# Patient Record
Sex: Female | Born: 2000 | Race: Black or African American | Hispanic: No | Marital: Single | Smoking: Current some day smoker
Health system: Southern US, Community
[De-identification: ages and names within clinical notes are randomized; demographics above are authoritative.]

## PROBLEM LIST (undated history)

## (undated) DIAGNOSIS — C562 Malignant neoplasm of left ovary: Secondary | ICD-10-CM

## (undated) HISTORY — PX: OOPHORECTOMY: SHX86

## (undated) HISTORY — DX: Malignant neoplasm of left ovary: C56.2

---
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
## 2020-08-02 DIAGNOSIS — H5213 Myopia, bilateral: Secondary | ICD-10-CM | POA: Diagnosis not present

## 2021-03-08 DIAGNOSIS — Z3046 Encounter for surveillance of implantable subdermal contraceptive: Secondary | ICD-10-CM | POA: Diagnosis not present

## 2021-03-08 DIAGNOSIS — Z113 Encounter for screening for infections with a predominantly sexual mode of transmission: Secondary | ICD-10-CM | POA: Diagnosis not present

## 2021-03-21 DIAGNOSIS — Z30017 Encounter for initial prescription of implantable subdermal contraceptive: Secondary | ICD-10-CM | POA: Diagnosis not present

## 2021-03-21 DIAGNOSIS — Z3046 Encounter for surveillance of implantable subdermal contraceptive: Secondary | ICD-10-CM | POA: Diagnosis not present

## 2021-03-21 DIAGNOSIS — Z3200 Encounter for pregnancy test, result unknown: Secondary | ICD-10-CM | POA: Diagnosis not present

## 2021-03-27 ENCOUNTER — Telehealth: Payer: Self-pay | Admitting: Obstetrics and Gynecology

## 2021-03-27 DIAGNOSIS — C801 Malignant (primary) neoplasm, unspecified: Secondary | ICD-10-CM

## 2021-03-27 NOTE — Telephone Encounter (Signed)
GYN Telephone Call Patient seen at [place] and I was going to set her up for nexplanon removal at the [facility] but nexplanon able to be removed at [facility] and place a new Nexplanon. While reviewing her chart, I saw that she has a h/o a malignant GCT. I contacted the patient at [phone] and she states she has not been seeing anyone for this since she last saw Gyn Onc in 2020. I d/w Dr. Berline Lopes and she recommends continued follow up. I told her we will set this up for her with Gyn Onc.   Durene Romans MD Attending Center for [employer] (Faculty Practice) 03/27/2021 Time: [phone]

## 2021-03-28 ENCOUNTER — Encounter: Payer: Self-pay | Admitting: Obstetrics & Gynecology

## 2021-03-28 ENCOUNTER — Telehealth: Payer: Self-pay | Admitting: *Deleted

## 2021-03-28 NOTE — Telephone Encounter (Signed)
Spoke with the patient and scheduled a follow up appt with Dr Delsa Sale

## 2021-03-29 NOTE — Assessment & Plan Note (Signed)
21 yo w/ Stage IA mixed germ cell tumor Negative symptom review, normal exam.  No evidence of recurrence  >annual f/u; tumor markers can be omitted >genetic testing at age 57

## 2021-03-29 NOTE — Progress Notes (Signed)
Follow Up Note: Gyn-Onc  Meagan Brown 21 y.o. female  CC: She presents for a f/u visit   HPI: The oncology history was reviewed.  Interval History: She denies abdominal distention, pain, weight loss or change in her bowel habits.   In 8/20 AFP was 1.6, HCG was < 1, LDH was 172.   Review of Systems  Review of Systems  Constitutional:  Negative for malaise/fatigue and weight loss.  Respiratory:  Negative for shortness of breath and wheezing.   Cardiovascular:  Negative for chest pain and leg swelling.  Gastrointestinal:  Negative for abdominal pain, blood in stool, constipation, nausea and vomiting.  Genitourinary:  Negative for dysuria, frequency, hematuria and urgency.  Musculoskeletal:  Negative for joint pain and myalgias.  Neurological:  Negative for weakness.  Psychiatric/Behavioral:  Negative for depression. The patient does not have insomnia.    Current medications, allergy, social history, past surgical history, past medical history, family history were all reviewed.    Vitals:  BP 131/72 (BP Location: Right Arm, Patient Position: Sitting)    Pulse 68    Temp 98.9 F (37.2 C) (Oral)    Resp 16    Ht 5' 5.75" (1.67 m)    Wt 217 lb 9.6 oz (98.7 kg)    SpO2 100%    BMI 35.39 kg/m    Physical Exam:  Physical Exam Exam conducted with a chaperone present.  Constitutional:      General: She is not in acute distress. Cardiovascular:     Rate and Rhythm: Normal rate and regular rhythm.  Pulmonary:     Effort: Pulmonary effort is normal.     Breath sounds: Normal breath sounds. No wheezing or rhonchi.  Abdominal:     Palpations: Abdomen is soft.     Tenderness: There is no abdominal tenderness. There is no right CVA tenderness or left CVA tenderness.     Hernia: No hernia is present.  Genitourinary:    General: Normal vulva.     Urethra: No urethral lesion.     Vagina: No lesions. No bleeding.  Thin white discharge present Musculoskeletal:     Cervical back: Neck  supple.     Right lower leg: No edema.     Left lower leg: No edema.  Lymphadenopathy:     Upper Body:     Right upper body: No supraclavicular adenopathy.     Left upper body: No supraclavicular adenopathy.     Lower Body: No right inguinal adenopathy. No left inguinal adenopathy.  Skin:    Findings: No rash.  Neurological:     Mental Status: She is oriented to person, place, and time.   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  No evidence of recurrence  >annual f/u; tumor markers can be omitted >genetic testing at age 55   I personally spent 25 minutes face-to-face and non-face-to-face in the care of this patient, which includes all pre, intra, and post visit time on the date of service.     Lahoma Crocker, MD

## 2021-03-30 ENCOUNTER — Other Ambulatory Visit: Payer: Self-pay

## 2021-03-30 ENCOUNTER — Inpatient Hospital Stay: Payer: Medicaid Other | Attending: Obstetrics & Gynecology | Admitting: Obstetrics & Gynecology

## 2021-03-30 ENCOUNTER — Encounter: Payer: Self-pay | Admitting: Obstetrics & Gynecology

## 2021-03-30 VITALS — BP 131/72 | HR 68 | Temp 98.9°F | Resp 16 | Ht 65.75 in | Wt 217.6 lb

## 2021-03-30 DIAGNOSIS — C579 Malignant neoplasm of female genital organ, unspecified: Secondary | ICD-10-CM | POA: Diagnosis not present

## 2021-03-30 DIAGNOSIS — C801 Malignant (primary) neoplasm, unspecified: Secondary | ICD-10-CM

## 2021-03-30 NOTE — Patient Instructions (Signed)
Return in 1 yr 

## 2021-04-03 ENCOUNTER — Telehealth: Payer: Self-pay

## 2021-04-03 DIAGNOSIS — Z09 Encounter for follow-up examination after completed treatment for conditions other than malignant neoplasm: Secondary | ICD-10-CM

## 2021-04-03 NOTE — Telephone Encounter (Signed)
SWCM called pt regarding adolescent transition policy. No answer, Left VM explaining that pt is now 21 y.o and needs an adult PCP, and will no longer be allowed to be seen at [facility]. Pt has not been seen in approx 3 years. Pt currently receiving care from OBGYN. SWCM left message for pt asking for a call back if pt wants assistance choosing and scheduling with an adult PCP. Pt also sent Adolescent Transition letter with same information left on VM.   Lenn Sink, BSW, QP Case Manager [employer] [facility] Office: [phone] Direct Number: [phone]

## 2021-04-04 ENCOUNTER — Telehealth: Payer: Self-pay

## 2021-04-04 DIAGNOSIS — Z09 Encounter for follow-up examination after completed treatment for conditions other than malignant neoplasm: Secondary | ICD-10-CM

## 2021-04-04 NOTE — Telephone Encounter (Signed)
SWCM called Practice Admin Susette Racer) at [facility] Internal Medicine to refer pt for adult primary care. Left VM for Doris to please call pt for scheduling initial visit.    Lenn Sink, BSW, QP Case Manager [employer] [facility] Child [facility] Office: [phone] Direct Number: [phone]

## 2021-04-13 ENCOUNTER — Other Ambulatory Visit: Payer: Self-pay

## 2021-04-13 ENCOUNTER — Encounter: Payer: Self-pay | Admitting: Internal Medicine

## 2021-04-13 ENCOUNTER — Ambulatory Visit (INDEPENDENT_AMBULATORY_CARE_PROVIDER_SITE_OTHER): Payer: Medicaid Other | Admitting: Internal Medicine

## 2021-04-13 DIAGNOSIS — E669 Obesity, unspecified: Secondary | ICD-10-CM

## 2021-04-13 DIAGNOSIS — Z0000 Encounter for general adult medical examination without abnormal findings: Secondary | ICD-10-CM | POA: Insufficient documentation

## 2021-04-13 DIAGNOSIS — N926 Irregular menstruation, unspecified: Secondary | ICD-10-CM

## 2021-04-13 DIAGNOSIS — E663 Overweight: Secondary | ICD-10-CM | POA: Insufficient documentation

## 2021-04-13 DIAGNOSIS — L732 Hidradenitis suppurativa: Secondary | ICD-10-CM | POA: Diagnosis not present

## 2021-04-13 HISTORY — DX: Overweight: E66.3

## 2021-04-13 NOTE — Assessment & Plan Note (Signed)
Patient is going to ask her grandmother if she completed the HPV vaccine series. If she did not, I recommended she get this vaccine to protect her against cervical cancer (especially given her history of the previous mixed germ cell tumor)  Declined flu and covid vaccines  Was last tested for gonorrhea/chlamydia in January by the health department, prior to getting her nexplanon inserted. Tests came back negative.

## 2021-04-13 NOTE — Assessment & Plan Note (Signed)
BMI 36 today. Discussed diet and exercise with the patient. She notes that her diet is sporadic and she only consistently eats two meals a day, as she works the night shift at [employer]. As far as exercise goes, she states that she gets to the gym about 2-3 times per week. Discussed trying to get in more often, as we recommend some form of physical activity each day.

## 2021-04-13 NOTE — Progress Notes (Signed)
° °  CC: establish care   HPI:  [name] is a 21 y.o. female with a history of a stage IA mixed germ cell tumor who presents to the [facility] to establish care. Please see problem-based list for further details, assessments, and plans.   Past Medical History:  Diagnosis Date   Ovarian cancer on left [facility])    s/p Exlap/LSO 09/2017 - Malignant Germ Cell Tumor   PMHx: germ cell tumor  FDLNMP: does not get regular menstrual cycles, has Nexplanon (placed 03/21/21) Meds: none Surg Hx: oophorectomy 2019 Social Hx: Vaping x 1 yr, no alcohol or drug use. Lives with grandmother in [place]. Works at [employer]. Exercises 2-3 times per week.  Family Hx: HTN, DM  Review of Systems:  Review of Systems  Constitutional:  Negative for chills and fever.  HENT: Negative.    Respiratory:  Negative for cough and shortness of breath.   Cardiovascular:  Negative for chest pain and palpitations.  Gastrointestinal:  Negative for diarrhea, nausea and vomiting.  Genitourinary: Negative.   Musculoskeletal:  Negative for back pain and myalgias.  Neurological:  Negative for dizziness, loss of consciousness and headaches.    Physical Exam:  Vitals:   04/13/21 0907  BP: 129/71  Pulse: 79  Temp: 98.4 F (36.9 C)  TempSrc: Oral  SpO2: 99%  Weight: 221 lb 9.6 oz (100.5 kg)   General: Pleasant, well-appearing female. No acute distress. CV: RRR. No murmurs. No LE edema Pulmonary: Lungs CTAB. Normal effort.  Abdominal: Soft, nontender, nondistended.  Extremities: Palpable radial and DP pulses.  Skin: Warm and dry. Neuro: A&Ox3. No focal deficit. Psych: Normal mood and affect   Assessment & Plan:   See Encounters Tab for problem based charting.  Patient discussed with Dr. Daryll Drown

## 2021-04-13 NOTE — Assessment & Plan Note (Signed)
Patient notes that a provider at the health department had previously told her she may have hidradenitis suppurativa. She notes that for the past year and a half, she will intermittently get "boils/cysts" under her armpits, which have since resolved, although it has left some scarring. Discussed with the patient that the next time she gets a flare, she should call our clinic to be seen. Will continue to monitor.

## 2021-04-13 NOTE — Assessment & Plan Note (Signed)
Patient has not had a menstrual period in a while/does not get them regularly. She has had the nexplanon for the past 4 years, most recently having it replaced on 1/31 by the health department.

## 2021-04-13 NOTE — Patient Instructions (Signed)
Thank you, Meagan Brown for allowing [place] to provide your care today. Today we discussed:  Healthcare maintenance: Ask your grandma if you completed the HPV vaccine series. If not, we would recommend you get these vaccines to help protect you from certain types of cervical cancer.  Diet/exercise: Continue going to the gym 2-3 times per week as you are able. We recommend some form of physical activity per day.   Possible hidradenitis suppurativa: Please call our clinic (and take a picture) when you develop another boil/cyst under your armpit so we can evaluate you and treat you   I have ordered the following labs for you:  Lab Orders  No laboratory test(s) ordered today     Tests ordered today:  none  Referrals ordered today:   Referral Orders  No referral(s) requested today     I have ordered the following medication/changed the following medications:   Stop the following medications: There are no discontinued medications.   Start the following medications: No orders of the defined types were placed in this encounter.    Follow up: 1 year    Remember: To call to be seen sooner if you develop another cyst/boil under your armpits.   Should you have any questions or concerns please call the internal medicine clinic at [phone].     Buddy Duty, D.O. [place]

## 2021-04-17 NOTE — Progress Notes (Signed)
Internal Medicine Clinic Attending ° °Case discussed with Dr. Atway  at the time of the visit.  We reviewed the resident’s history and exam and pertinent patient test results.  I agree with the assessment, diagnosis, and plan of care documented in the resident’s note.  °

## 2021-04-28 ENCOUNTER — Telehealth: Payer: Self-pay | Admitting: *Deleted

## 2021-04-28 NOTE — Telephone Encounter (Signed)
Patient called and stated "I used the bathroom last night and had blood in my stool. I wanted to get this checked out." Per Dr Berline Lopes patient needs to call her PCP  ?

## 2021-12-06 ENCOUNTER — Other Ambulatory Visit: Payer: Self-pay | Admitting: Student

## 2021-12-06 ENCOUNTER — Encounter: Payer: Self-pay | Admitting: Student

## 2021-12-06 ENCOUNTER — Ambulatory Visit (INDEPENDENT_AMBULATORY_CARE_PROVIDER_SITE_OTHER): Payer: Medicaid Other | Admitting: Student

## 2021-12-06 VITALS — BP 139/81 | HR 98 | Temp 98.3°F | Ht 64.0 in | Wt 229.3 lb

## 2021-12-06 DIAGNOSIS — Z6839 Body mass index (BMI) 39.0-39.9, adult: Secondary | ICD-10-CM | POA: Diagnosis not present

## 2021-12-06 DIAGNOSIS — E669 Obesity, unspecified: Secondary | ICD-10-CM | POA: Diagnosis not present

## 2021-12-06 DIAGNOSIS — H547 Unspecified visual loss: Secondary | ICD-10-CM

## 2021-12-06 DIAGNOSIS — Z0000 Encounter for general adult medical examination without abnormal findings: Secondary | ICD-10-CM

## 2021-12-06 DIAGNOSIS — Z0101 Encounter for examination of eyes and vision with abnormal findings: Secondary | ICD-10-CM

## 2021-12-06 MED ORDER — SEMAGLUTIDE-WEIGHT MANAGEMENT 0.25 MG/0.5ML ~~LOC~~ SOAJ: SUBCUTANEOUS | 55 refills | Status: DC

## 2021-12-06 NOTE — Progress Notes (Unsigned)
   CC: Follow-up  HPI:  [name] is a 21 y.o. female with history as below presents for follow-up visit.  Please see encounters tab for problem-based charting.  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 In no acute distress. HENT: Normocephalic, atraumatic,  Eyes: Sclera non-icteric, PERRL, EOM intact.  Limited funduscopic exam without abnormalities Vision screen: OD 20/15, OS 20/100 Cardio:Regular rate and rhythm. No murmurs, rubs, or gallops. 2+ bilateral radial and dorsalis pedis  pulses. Pulm:Clear to auscultation bilaterally. Normal work of breathing on room air. Abdomen: Soft, non-tender, non-distended, positive bowel sounds. UEA:[id number] for extremity edema. Skin:Warm and dry. Neuro:Alert and oriented x3. No focal deficit noted. Psych:Pleasant mood and affect.   Assessment & Plan:   See Encounters Tab for problem based charting.  Patient seen with Dr. Philipp Ovens

## 2021-12-06 NOTE — Patient Instructions (Signed)
  Thank you, [place], for allowing [place] to provide your care today. Today we discussed . . .  > Weight loss       -We believe that after trying all of the lifestyle modifications and not having success in weight loss that you would benefit from a medication.  We will start you on semaglutide 0.25 mg weekly and then plan to increase it every 4 weeks to 0.5 mg then 1 mg then 1.7 mg then 2.4 mg weekly.  We also discussed changing the Nexplanon for an IUD and you may call us to get a referral to OB/GYN at any time if you decide you would like to try that.  I have also attached information about the Mediterranean and Dash diet that may be helpful. > Poor vision       -We will send you to a ophthalmologist to have your eyes evaluated as you have had trouble getting glasses that work for you. > HPV vaccine       -You may get your HPV vaccines at the health department and we recommend that you do this.  I have attached some information about HPV to this.   I have ordered the following labs for you:   Lab Orders         Hemoglobin A1c       Tests ordered today:  None   Referrals ordered today:    Referral Orders         Ambulatory referral to Ophthalmology       I have ordered the following medication/changed the following medications:   Stop the following medications: There are no discontinued medications.   Start the following medications: No orders of the defined types were placed in this encounter.     Follow up: 1 year    Remember:     Should you have any questions or concerns please call the internal medicine clinic at [phone].     Johny Blamer, [place]

## 2021-12-07 ENCOUNTER — Encounter: Payer: Self-pay | Admitting: Student

## 2021-12-07 LAB — HEMOGLOBIN A1C
Est. average glucose Bld gHb Est-mCnc: 114 mg/dL
Hgb A1c MFr Bld: 5.6 % (ref 4.8–5.6)

## 2021-12-07 NOTE — Progress Notes (Signed)
Internal Medicine Clinic Attending  Case discussed with Dr. Nikki Dom  At the time of the visit.  We reviewed the resident's history and exam and pertinent patient test results.  I agree with the assessment, diagnosis, and plan of care documented in the resident's note.

## 2021-12-07 NOTE — Progress Notes (Signed)
Patient called and updated on the results of her hemoglobin A1c of 5.6.  All questions were answered.

## 2021-12-07 NOTE — Assessment & Plan Note (Addendum)
Patient is still worried about her weight.  Weight is stable from prior visit in 03/2021.  She states her weight noticeably went up after she got her first Nexplanon several years ago.  She states that she is very active at work and does cardio exercise 2 times a week.  She does state that her diet is hard to keep under control due to her long working hours and eating late at night.  She has tried several restrictive diets without much success.  She was counseled on the Mediterranean and Dash diet today.  We discussed medical therapies with GLP-1's and she is interested in these.  Patient also counseled on the IUD having less weight gain side effects than the systemic progesterone birth control.  She will consider and may get referral to OB/GYN if she wants.  Patient had an episode 2 weeks ago of 2 hours of blurry vision and tension headache like her normal headaches.  No inciting event and went away after she got saline eat.  Had some mild nausea but no vomiting.  She checked her blood glucose using her grandmothers monitor and it was 90 during the event.  No previous similar events or subsequent. - Continue with lifestyle modification and start semaglutide if able to get insurance coverage - A1c today

## 2021-12-07 NOTE — Assessment & Plan Note (Signed)
Patient is still unaware if she did or did not get the HPV vaccine.  Patient given information on HPV today and told that she may get the HPV vaccine at the health department.

## 2021-12-07 NOTE — Assessment & Plan Note (Addendum)
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  She has been to the optometrist multiple times without satisfactory results in her glasses.  She denies any acute worsening in her vision.  Vision testing today showed OD 20/15, OS 20/100. - Referral to ophthalmology for further evaluation

## 2022-02-21 ENCOUNTER — Other Ambulatory Visit: Payer: Self-pay

## 2022-02-21 ENCOUNTER — Ambulatory Visit: Payer: Medicaid Other

## 2022-02-21 VITALS — BP 126/80 | HR 82 | Temp 98.5°F | Ht 64.0 in | Wt 230.7 lb

## 2022-02-21 DIAGNOSIS — M546 Pain in thoracic spine: Secondary | ICD-10-CM | POA: Diagnosis not present

## 2022-02-21 DIAGNOSIS — M549 Dorsalgia, unspecified: Secondary | ICD-10-CM | POA: Insufficient documentation

## 2022-02-21 DIAGNOSIS — E669 Obesity, unspecified: Secondary | ICD-10-CM

## 2022-02-21 DIAGNOSIS — L732 Hidradenitis suppurativa: Secondary | ICD-10-CM

## 2022-02-21 HISTORY — DX: Dorsalgia, unspecified: M54.9

## 2022-02-21 MED ORDER — SEMAGLUTIDE-WEIGHT MANAGEMENT 0.25 MG/0.5ML ~~LOC~~ SOAJ: SUBCUTANEOUS | 55 refills | Status: AC

## 2022-02-21 MED ORDER — DOXYCYCLINE HYCLATE 50 MG PO CAPS: 100.0000 mg | ORAL_CAPSULE | Freq: Every day | ORAL | 0 refills | Status: DC

## 2022-02-21 NOTE — Assessment & Plan Note (Signed)
Endorses significant weight gain after nexplanon placement. Discussed continued lifestyle changes including exercise recommendations and dietary changes which were detailed at last visit. She is still working a lot and has difficulty with managing her diet. Never was able to pick up ozempic which was previously prescribed.   Will continue working on lifestyle changes and referred to nutrition as well. Resent ozempic. Discussed switching nexplanon for IUD but she is considering cessation of contraception after 6 months-1 yr to try to have a child so IUD placement might not make sense for that timeframe.

## 2022-02-21 NOTE — Assessment & Plan Note (Addendum)
Has active HS lesions under armpits and on R inguinal fold with scarring and small draining cysts which are consistent with HS. Has had flares of these lesions for the past 1-2 years. A1c was obtained at last visit and wnl. She does not smoke cigarettes, but vapes.   Discussed vaping cessation and wound care/cleaning. Started doxycyline '100mg'$  daily and will continue until next visit in 4 weeks but likely will need to keep going for a couple months and then reassess. Also will refer to [facility] clinic at [facility] dermatology.

## 2022-02-21 NOTE — Assessment & Plan Note (Addendum)
Has upper back pain and neck pain around trapezius muscles. She says this has gotten worse due to weight gain after having nexplanon placed and thinks this might be due to breast enlargement. Pain feels sore and improves when she wears supportive bras and temporarily with massages. Asked about breast reduction surgery. Exam is unremarkable.  Will treat conservatively and focus on weight loss and ibuprofen, back massages/exercises, and warm compresses.

## 2022-02-21 NOTE — Progress Notes (Signed)
   CC: back.neck pain  HPI:  Meagan Brown is a 22 y.o.-year-old female with past medical history as below presenting for back/neck pain,. HS, obesity.  Please see encounters tab for problem-based charting.  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  Please see encounters tab for problem based charting.  Physical Exam:  Vitals:   02/21/22 1426  BP: 126/80  Pulse: 82  Temp: 98.5 F (36.9 C)  SpO2: 100%  Weight: 230 lb 11.2 oz (104.6 kg)  Height: '5\' 4"'$  (1.626 m)   General:Well-appearing, pleasant, In NAD Cardiac: RRR, no murmurs rubs or gallops. Respiratory: Normal work of breathing on room air, CTAB Abdominal: Soft, nontender, nondistended Skin: scarring and draining small ulcers under bilateral axillae and in R inguinal fold MSK: no point tenderness spine, minimal tenderness over upper back and neck musculature.  Assessment & Plan:   Back pain Has upper back pain and neck pain around trapezius muscles. She says this has gotten worse due to weight gain after having nexplanon placed and thinks this might be due to breast enlargement. Pain feels sore and improves when she wears supportive bras and temporarily with massages. Asked about breast reduction surgery. Exam is unremarkable.  Will treat conservatively and focus on weight loss and ibuprofen, back massages/exercises, and warm compresses.   Obesity (BMI 30-39.9) Endorses significant weight gain after nexplanon placement. Discussed continued lifestyle changes including exercise recommendations and dietary changes which were detailed at last visit. She is still working a lot and has difficulty with managing her diet. Never was able to pick up ozempic which was previously prescribed.   Will continue working on lifestyle changes and  referred to nutrition as well. Resent ozempic. Discussed switching nexplanon for IUD but she is considering cessation of contraception after 6 months-1 yr to try to have a child so IUD placement might not make sense for that timeframe.   Hidradenitis suppurativa Has active HS lesions under armpits and on R inguinal fold with scarring and small draining cysts which are consistent with HS. Has had flares of these lesions for the past 1-2 years. A1c was obtained at last visit and wnl. She does not smoke cigarettes, but vapes.   Discussed vaping cessation and wound care/cleaning. Started doxycyline '100mg'$  daily and will continue until next visit in 4 weeks but likely will need to keep going for a couple months and then reassess. Also will refer to [facility] clinic at [facility] dermatology.    Patient discussed with Dr. Dareen Piano

## 2022-02-21 NOTE — Patient Instructions (Addendum)
[place], it was a pleasure seeing you today! You endorsed feeling well today. Below are some of the things we talked about this visit. We look forward to seeing you in the follow up appointment!  Today we discussed: Hidradenitis: Please take '100mg'$  of doxycycline daily for the next 4 weeks until your next appointment and we will reassess at that point. You will likely need to continue taking the medicine for longer depending on how your skin wounds look then. Continue taking good care of the wounds by washing them thoroughly and try to use dressings that won't stick too much to the skin. I will be referring you to the [facility] HS clinic as well.  Weight loss: Try to pick up ozempic and continue working on exercise and your meal plan. You can also meet with our nutritionist if you think that would help. For your neck and back pain, you can try supportive bras, ibuprofen, warm compresses, and massages/back exercises  I have ordered the following labs today:  Lab Orders  No laboratory test(s) ordered today      Referrals ordered today:   Referral Orders  No referral(s) requested today     I have ordered the following medication/changed the following medications:   Stop the following medications: There are no discontinued medications.   Start the following medications: No orders of the defined types were placed in this encounter.    Follow-up:  4 weeks    Please make sure to arrive 15 minutes prior to your next appointment. If you arrive late, you may be asked to reschedule.   We look forward to seeing you next time. Please call our clinic at [phone] if you have any questions or concerns. The best time to call is Monday-Friday from 9am-4pm, but there is someone available 24/7. If after hours or the weekend, call the main hospital number and ask for the Internal Medicine Resident On-Call. If you need medication refills, please notify your pharmacy one week in advance and they  will send [place] a request.  Thank you for letting us take part in your care. Wishing you the best!  Thank you, Linus Galas MD

## 2022-02-27 ENCOUNTER — Telehealth: Payer: Self-pay | Admitting: *Deleted

## 2022-02-27 NOTE — Telephone Encounter (Signed)
Call from pt who stated she was prescribed Doxycycline ( 02/21/21 per Dr Jodell Cipro); she took it x 1 day, developed N/V and diarrhea. Stated she had to go home from work and this lasted until the next day. She wants to know what should she do ? Thanks

## 2022-03-01 NOTE — Addendum Note (Signed)
Addended by: Aldine Contes on: 03/01/2022 11:27 AM   Modules accepted: Level of Service

## 2022-03-01 NOTE — Progress Notes (Signed)
Internal Medicine Clinic Attending  Case discussed with Dr. Jodell Cipro  At the time of the visit.  We reviewed the resident's history and exam and pertinent patient test results.  I agree with the assessment, diagnosis, and plan of care documented in the resident's note.

## 2022-03-02 MED ORDER — MINOCYCLINE HCL 100 MG PO CAPS: 100.0000 mg | ORAL_CAPSULE | Freq: Two times a day (BID) | ORAL | 0 refills | Status: DC

## 2022-03-02 NOTE — Telephone Encounter (Signed)
Patient called about n/v after trying doxycycline which was started for HS. Changed doxycycline to minocycline '100mg'$  BID and precautioned about n/v and photosensitivity. If she also has a reaction to this we can try clindamycin and rifampin for coverage in the future. Still waiting on HS clinic referral.

## 2022-03-23 ENCOUNTER — Encounter: Payer: Self-pay | Admitting: Student

## 2022-03-23 ENCOUNTER — Ambulatory Visit (INDEPENDENT_AMBULATORY_CARE_PROVIDER_SITE_OTHER): Payer: Medicaid Other | Admitting: Student

## 2022-03-23 VITALS — BP 125/79 | HR 81 | Temp 98.2°F | Wt 229.4 lb

## 2022-03-23 DIAGNOSIS — L732 Hidradenitis suppurativa: Secondary | ICD-10-CM

## 2022-03-23 DIAGNOSIS — C801 Malignant (primary) neoplasm, unspecified: Secondary | ICD-10-CM

## 2022-03-23 MED ORDER — CLINDAMYCIN PHOSPHATE 1 % EX SOLN: Freq: Two times a day (BID) | CUTANEOUS | 1 refills | Status: DC

## 2022-03-23 NOTE — Patient Instructions (Signed)
Meagan Brown,  It was a pleasure seeing you in the clinic today.   As we discussed, I have prescribed a topical antibiotic called clindamycin. Please apply this to your affected areas twice a day. Please make sure to contact your gynecologist at the cancer center to schedule an appointment for follow up and to get your pap smear. Please come back to see [place] in 3 months (or sooner if needed).  Please call our clinic at [phone] if you have any questions or concerns. The best time to call is Monday-Friday from 9am-4pm, but there is someone available 24/7 at the same number. If you need medication refills, please notify your pharmacy one week in advance and they will send [place] a request.   Thank you for letting us take part in your care. We look forward to seeing you next time!

## 2022-03-23 NOTE — Assessment & Plan Note (Signed)
She is due for follow up with Gyn-Onc. She is going to call them to schedule this. Refused PAP smear today, states will get it done with Gyn-Onc.

## 2022-03-23 NOTE — Progress Notes (Signed)
   CC: f/u hidradenitis supportiva  HPI:  [name] is a 22 y.o. female with history listed below presenting to the [facility] for f/u of hidradenitis supportiva. Please see individualized problem based charting for full HPI.  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  Physical Exam:  Vitals:   03/23/22 0853  BP: 125/79  Pulse: 81  Temp: 98.2 F (36.8 C)  TempSrc: Oral  SpO2: 100%  Weight: 229 lb 6.4 oz (104.1 kg)   Physical Exam Constitutional:      Appearance: Normal appearance. She is obese. She is not ill-appearing.  HENT:     Mouth/Throat:     Mouth: Mucous membranes are moist.     Pharynx: Oropharynx is clear.  Eyes:     Extraocular Movements: Extraocular movements intact.     Conjunctiva/sclera: Conjunctivae normal.     Pupils: Pupils are equal, round, and reactive to light.  Cardiovascular:     Rate and Rhythm: Normal rate and regular rhythm.     Pulses: Normal pulses.     Heart sounds: Normal heart sounds. No murmur heard.    No friction rub. No gallop.  Pulmonary:     Effort: Pulmonary effort is normal. No respiratory distress.     Breath sounds: Normal breath sounds.  Abdominal:     General: Bowel sounds are normal. There is no distension.     Palpations: Abdomen is soft.     Tenderness: There is no abdominal tenderness.  Musculoskeletal:        General: No swelling. Normal range of motion.  Skin:    General: Skin is warm and dry.     Comments: Patient with hypertrophic scarring of axillae bilaterally. She has 1 small area on each side with mild inflammation, but otherwise no areas of fluctuance, drainage, skin tunnels noted. No TTP of bilateral axillary regions. Patient refused examination of R inguinal  area.  Neurological:     General: No focal deficit present.     Mental Status: She is alert and oriented to person, place, and time.  Psychiatric:        Mood and Affect: Mood normal.        Behavior: Behavior normal.      Assessment & Plan:   See Encounters Tab for problem based charting.  Patient discussed with Dr.  Saverio Danker

## 2022-03-23 NOTE — Assessment & Plan Note (Signed)
Patient living with HS, primarily located in bilateral armpits and her R inguinal fold. She does note having flares for the past couple of years in these areas, with her R axillary region typically being the most affected. She was started on doxycycline last month but developed nausea from this and was switched to minocycline. She states that she took the minocycline for about 2 weeks but notes increased nightmares with this and thus stopped taking it. She does not feel that she tolerates oral antibiotics well and thus wishes to discuss other options.  On exam, she does have hypertrophic scarring of axillary regions bilaterally. She does have a small area on each side with mild inflammation but no areas of fluctuance, drainage, sinus tracts noted. No TTP of these regions as well. She did refuse examination of her R inguinal fold but states that area has improved.   We discussed switching to topical clindamycin solution for management of her affected areas given inability to tolerate tetracycline therapy. She is interested in using this.   She continues to vape and is not currently interested in quitting. We also discussed maintain good skin hygiene and discussed importance of weight loss.  Plan: -switch to topical clindamycin solution BID -skin hygiene, weight loss, vaping cessation (when ready)

## 2022-03-26 NOTE — Progress Notes (Signed)
Internal Medicine Clinic Attending  Case discussed with Dr. Jinwala  At the time of the visit.  We reviewed the resident's history and exam and pertinent patient test results.  I agree with the assessment, diagnosis, and plan of care documented in the resident's note.  

## 2022-05-01 ENCOUNTER — Telehealth: Payer: Self-pay | Admitting: Dietician

## 2022-05-01 NOTE — Telephone Encounter (Signed)
Left voicemail for return call to discuss her referral/appointment.

## 2022-05-02 ENCOUNTER — Encounter: Payer: Medicaid Other | Admitting: Dietician

## 2022-05-23 ENCOUNTER — Encounter: Payer: Self-pay | Admitting: *Deleted

## 2022-05-23 ENCOUNTER — Inpatient Hospital Stay: Payer: Medicaid Other

## 2022-05-23 ENCOUNTER — Encounter: Payer: Self-pay | Admitting: Obstetrics & Gynecology

## 2022-05-23 ENCOUNTER — Inpatient Hospital Stay: Payer: Medicaid Other | Attending: Obstetrics & Gynecology | Admitting: Obstetrics & Gynecology

## 2022-05-23 ENCOUNTER — Other Ambulatory Visit (HOSPITAL_COMMUNITY)
Admission: RE | Admit: 2022-05-23 | Discharge: 2022-05-23 | Disposition: A | Discharge status: Home / Self Care | Payer: Medicaid Other | Source: Ambulatory Visit | Attending: Obstetrics & Gynecology | Admitting: Obstetrics & Gynecology

## 2022-05-23 ENCOUNTER — Other Ambulatory Visit: Payer: Self-pay

## 2022-05-23 VITALS — BP 138/87 | HR 85 | Temp 98.7°F | Resp 16 | Ht 64.0 in | Wt 230.2 lb

## 2022-05-23 DIAGNOSIS — C801 Malignant (primary) neoplasm, unspecified: Secondary | ICD-10-CM

## 2022-05-23 DIAGNOSIS — Z113 Encounter for screening for infections with a predominantly sexual mode of transmission: Secondary | ICD-10-CM | POA: Diagnosis not present

## 2022-05-23 DIAGNOSIS — Z124 Encounter for screening for malignant neoplasm of cervix: Secondary | ICD-10-CM | POA: Insufficient documentation

## 2022-05-23 DIAGNOSIS — Z90721 Acquired absence of ovaries, unilateral: Secondary | ICD-10-CM | POA: Diagnosis not present

## 2022-05-23 DIAGNOSIS — Z8543 Personal history of malignant neoplasm of ovary: Secondary | ICD-10-CM | POA: Insufficient documentation

## 2022-05-23 NOTE — Assessment & Plan Note (Signed)
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  No evidence of recurrence   >annual f/u w/a generalist gynecologist is appropriate >germ line genetic testing

## 2022-05-23 NOTE — Patient Instructions (Signed)
Lidocaine patch (Salonpas) as needed  Return in 1 year

## 2022-05-23 NOTE — Progress Notes (Signed)
Follow Up Note: Gyn-Onc  Meagan Brown 22 y.o. female  CC: She presents for a f/u visit   HPI: The oncology history was reviewed.  Interval History: She denies abdominal distention, pain, weight loss or change in her bowel habits.  C/O tender area at incision; this is a chronic issue.  She has not initiated Pap testing.  HPV vaccine UTD.  Currently SA.   Diagnosed w/HS--mostly axillary involvement.  Review of Systems  Review of Systems  Constitutional:  Negative for malaise/fatigue and weight loss.  Respiratory:  Negative for shortness of breath and wheezing.   Cardiovascular:  Negative for chest pain and leg swelling.  Gastrointestinal:  Negative for abdominal pain, blood in stool, constipation, nausea and vomiting.  Genitourinary:  Negative for dysuria, frequency, hematuria and urgency.  Musculoskeletal:  Negative for joint pain and myalgias.  Neurological:  Negative for weakness.  Psychiatric/Behavioral:  Negative for depression. The patient does not have insomnia.    Current medications, allergy, social history, past surgical history, past medical history, family history were all reviewed.    Vitals:  BP 131/72 (BP Location: Right Arm, Patient Position: Sitting)   Pulse 68   Temp 98.9 F (37.2 C) (Oral)   Resp 16   Ht 5' 5.75" (1.67 m)   Wt 217 lb 9.6 oz (98.7 kg)   SpO2 100%   BMI 35.39 kg/m    Physical Exam:  Physical Exam Exam conducted with a chaperone present.  Constitutional:      General: She is not in acute distress. Cardiovascular:     Rate and Rhythm: Normal rate and regular rhythm.  Pulmonary:     Effort: Pulmonary effort is normal.     Breath sounds: Normal breath sounds. No wheezing or rhonchi.  Abdominal:     Palpations: Abdomen is soft.     Tenderness: There is no abdominal tenderness. There is no right CVA tenderness or left CVA tenderness.     Hernia: No hernia is present.  Genitourinary:    General: Normal vulva.     Urethra: No urethral  lesion.     Vagina: No lesions. No bleeding.  Thin white discharge present Musculoskeletal:     Cervical back: Neck supple.     Right lower leg: No edema.     Left lower leg: No edema.  Lymphadenopathy:     Upper Body:     Right upper body: No supraclavicular adenopathy.     Left upper body: No supraclavicular adenopathy.     Lower Body: No right inguinal adenopathy. No left inguinal adenopathy.  Skin:    Findings: No rash.  Neurological:     Mental Status: She is oriented to person, place, and time.   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  No evidence of recurrence  >annual f/u; >referral placed for genetic testing   >await Pap test results  I personally spent 25 minutes face-to-face and non-face-to-face in the care of this patient, which includes all pre, intra, and post visit time on the date of service.     Lahoma Crocker, MD

## 2022-05-24 ENCOUNTER — Other Ambulatory Visit: Payer: Self-pay | Admitting: Oncology

## 2022-05-24 DIAGNOSIS — C801 Malignant (primary) neoplasm, unspecified: Secondary | ICD-10-CM

## 2022-05-24 NOTE — Progress Notes (Signed)
Entered order for cytology from [facility] to replace order for [employer].

## 2022-05-25 ENCOUNTER — Telehealth: Payer: Self-pay

## 2022-05-25 LAB — CYTOLOGY - PAP: Diagnosis: NEGATIVE

## 2022-05-25 LAB — GC/CHLAMYDIA PROBE AMP (~~LOC~~) NOT AT ARMC
Chlamydia: NEGATIVE
Comment: NEGATIVE
Comment: NORMAL
Neisseria Gonorrhea: NEGATIVE

## 2022-05-25 NOTE — Telephone Encounter (Signed)
LVM for patient to call office regarding lab results.

## 2022-05-25 NOTE — Telephone Encounter (Signed)
-----   Message from Doylene Bode, NP sent at 05/25/2022  2:52 PM EDT ----- Please let her know her Gonorrhea/Chlamydia swab taken at her visit is negative.  ----- Message ----- From: Interface, Lab In Three Zero One Sent: 05/25/2022   1:59 PM EDT To: Doylene Bode, NP

## 2022-05-28 NOTE — Telephone Encounter (Signed)
Pt is aware of negative result.

## 2022-05-29 ENCOUNTER — Telehealth: Payer: Self-pay

## 2022-05-29 NOTE — Telephone Encounter (Signed)
Per Warner Mccreedy NP Pt notified about pap results: negative.  No questions or concerns voiced.

## 2022-05-31 ENCOUNTER — Telehealth: Payer: Self-pay | Admitting: *Deleted

## 2022-05-31 NOTE — Telephone Encounter (Signed)
Per Dr Tamela Oddi fax records to [facility] for a new patient referral

## 2022-06-01 NOTE — Telephone Encounter (Signed)
Received a fax [facility] doesn't accept patient's insurance. New referral fax to [facility] dermatology

## 2022-06-11 NOTE — Telephone Encounter (Signed)
Patient has appt at [facility] on 12/17 t 9:30 am with Dr Delanna Ahmadi. That office will send the patient a new patient letter in the mail.  LMOM for the patient to call the office back for the appt information.

## 2022-06-15 NOTE — Telephone Encounter (Signed)
Spoke with the patient and gave information for referral

## 2022-06-21 ENCOUNTER — Encounter: Payer: Medicaid Other | Admitting: Student

## 2022-07-01 ENCOUNTER — Ambulatory Visit (HOSPITAL_COMMUNITY)
Admission: EM | Admit: 2022-07-01 | Discharge: 2022-07-01 | Disposition: A | Discharge status: Home / Self Care | Payer: Medicaid Other | Attending: Emergency Medicine | Admitting: Emergency Medicine

## 2022-07-01 DIAGNOSIS — R220 Localized swelling, mass and lump, head: Secondary | ICD-10-CM | POA: Diagnosis not present

## 2022-07-01 DIAGNOSIS — L739 Follicular disorder, unspecified: Secondary | ICD-10-CM | POA: Diagnosis not present

## 2022-07-01 MED ORDER — DOXYCYCLINE HYCLATE 100 MG PO CAPS: 100.0000 mg | ORAL_CAPSULE | Freq: Two times a day (BID) | ORAL | 0 refills | Status: DC

## 2022-07-01 MED ORDER — ONDANSETRON 4 MG PO TBDP: 4.0000 mg | ORAL_TABLET | Freq: Three times a day (TID) | ORAL | 0 refills | Status: DC | PRN

## 2022-07-01 NOTE — Discharge Instructions (Addendum)
You appear to have an early infection in your left cheek.  I am unsure if this is from an ingrown hair, an early abscess, or the exact cause.  I am covering you with antibiotics due to your facial swelling and abrupt onset.  Please take the doxycycline twice daily with a meal to help prevent gastrointestinal upset.  You can also use the nausea medicine as needed.  You can ice the area for swelling.  You can alternate between Tylenol and ibuprofen every 4-6 hours for pain and swelling.  Please follow-up with your primary care provider early next week to ensure improvement.  You can return to clinic if you have any new or concerning symptoms.

## 2022-07-01 NOTE — ED Provider Notes (Signed)
[facility]    CSN: [id number] Arrival date & time: 07/01/22  1215      History   Chief Complaint Chief Complaint  Patient presents with   Facial Swelling    HPI Meagan Brown is a 22 y.o. female.    Reports left sided facial swelling that started last night.  She has not tried anything for the pain.  She denies any fevers or recent sick contacts.  She denies any known dental issues, dental caries or broken teeth.  She does have a tender, round and mobile lesion with associated left-sided facial swelling.  No obvious dental abscesses, no drainage.  She does have a history of at bedtime, normally gets abscesses under her arms and around her thighs.  Has never had an abscess or flareup on her face.   The history is provided by the patient and medical records.    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      Home Medications    Prior to Admission medications   Medication Sig Start Date End Date Taking? Authorizing Provider  doxycycline (VIBRAMYCIN) 100 MG capsule Take 1 capsule (100 mg total) by mouth 2 (two) times daily. 07/01/22  Yes Rinaldo Ratel, [place] N, FNP  ondansetron (ZOFRAN-ODT) 4 MG disintegrating tablet Take 1 tablet (4 mg total) by mouth every 8 (eight) hours as needed for nausea or vomiting. 07/01/22  Yes Rinaldo Ratel, [place]  N, FNP  clindamycin (CLEOCIN T) 1 % external solution Apply topically 2 (two) times daily. 03/23/22   Merrilyn Puma, MD    Family History Family History  Problem Relation Age of Onset   Diabetes Maternal Grandmother    Ovarian cysts Paternal Grandfather    Diabetes Other    Breast cancer Other 50   Ovarian cysts Other    Ovarian cysts Other    Ovarian cysts Other    Cancer Other        Gyn - died with vaginal bleeding    Social History Social History   Tobacco Use   Smoking status: Never    Passive exposure: Yes   Smokeless tobacco: Never  Vaping Use   Vaping Use: Never used  Substance Use Topics   Alcohol use: Never    Alcohol/week: 0.0 standard drinks of alcohol   Drug use: Not Currently    Types: Marijuana    Comment: 2018     Allergies   Tomato and Minocycline   Review of Systems Review of Systems  Constitutional:  Negative for chills, fatigue and fever.  HENT:  Positive for facial swelling. Negative for dental problem, mouth sores and sore throat.   Respiratory:  Negative for cough.      Physical Exam Triage Vital Signs ED Triage Vitals  Enc Vitals Group     BP 07/01/22 1236 (!) 136/99  Pulse Rate 07/01/22 1236 94     Resp 07/01/22 1236 18     Temp 07/01/22 1236 98.4 F (36.9 C)     Temp Source 07/01/22 1236 Oral     SpO2 07/01/22 1236 98 %     Weight --      Height --      Head Circumference --      Peak Flow --      Pain Score 07/01/22 1239 6     Pain Loc --      Pain Edu? --      Excl. in GC? --    No data found.  Updated Vital Signs BP (!) 136/99 (BP Location: Left Arm)   Pulse 94   Temp 98.4 F (36.9 C) (Oral)   Resp 18   SpO2 98%   Visual Acuity Right Eye Distance:   Left Eye Distance:   Bilateral Distance:    Right Eye Near:   Left Eye Near:    Bilateral Near:     Physical Exam Vitals and nursing note reviewed.  Constitutional:      Appearance: Normal appearance.  HENT:     Head: Normocephalic and atraumatic.       Comments: Tender mobile lesion to her left cheek.  No obvious head, drainage or lymph node involvement.  Surrounding left cheek edema.    Right Ear: External ear normal.     Left Ear: External ear normal.     Nose: Nose normal.     Mouth/Throat:     Mouth: Mucous membranes are moist.     Dentition: Normal dentition. No dental tenderness, gingival swelling, dental caries, dental abscesses or gum lesions.     Tongue: No lesions.     Palate: No mass.     Pharynx: Oropharynx is clear. Uvula midline.     Tonsils: No tonsillar exudate or tonsillar abscesses.  Eyes:     Conjunctiva/sclera: Conjunctivae normal.  Cardiovascular:     Rate and Rhythm: Normal rate.  Pulmonary:     Effort: Pulmonary effort is normal. No respiratory distress.  Skin:    General: Skin is warm and dry.     Findings: Lesion present.  Neurological:     General: No focal deficit present.     Mental Status: She is alert and oriented to person, place, and time.  Psychiatric:        Mood and Affect: Mood normal.        Behavior: Behavior normal. Behavior is cooperative.      UC Treatments / Results  Labs (all labs ordered are listed, but only abnormal results are displayed) Labs Reviewed - No data to display  EKG   Radiology No results found.  Procedures Procedures (including critical care time)  Medications Ordered in UC Medications - No data to display  Initial Impression / Assessment and Plan / UC Course  I have reviewed the triage vital signs and the nursing notes.  Pertinent labs & imaging results that were available during my care of the patient were reviewed by me and considered in my medical decision making (see chart for details).  Vitals and triage reviewed, patient is hemodynamically stable.  Tender mobile lesion to her left cheek with associated left cheek swelling.  Does have a history of at bedtime.  Due to rapid onset facial swelling, will cover with antibiotics.  Will cover with  doxycycline, patient has had nausea and vomiting with this medication in the past because she did  not take it with food.  Advised to take this with food and given nausea medicine as needed.  Advise follow-up with primary care provider.  Plan of care, follow-up care, return precautions discussed, no questions at this time.    Final Clinical Impressions(s) / UC Diagnoses   Final diagnoses:  Folliculitis  Left facial swelling     Discharge Instructions      You appear to have an early infection in your left cheek.  I am unsure if this is from an ingrown hair, an early abscess, or the exact cause.  I am covering you with antibiotics due to your facial swelling and abrupt onset.  Please take the doxycycline twice daily with a meal to help prevent gastrointestinal upset.  You can also use the nausea medicine as needed.  You can ice the area for swelling.  You can alternate between Tylenol and ibuprofen every 4-6 hours for pain and swelling.  Please follow-up with your primary care provider early next week to ensure improvement.  You can return to clinic if you have any new or concerning symptoms.      ED Prescriptions     Medication Sig Dispense Auth. Provider   doxycycline (VIBRAMYCIN) 100 MG capsule Take 1 capsule (100 mg total) by mouth 2 (two) times daily. 20 capsule Rinaldo Ratel, [place] N, FNP   ondansetron (ZOFRAN-ODT) 4 MG disintegrating tablet Take 1 tablet (4 mg total) by mouth every 8 (eight) hours as needed for nausea or vomiting. 20 tablet Eldean Nanna, [place] N, [place]      PDMP not reviewed this encounter.   [place] White[place], [place] N, [place] 07/01/22 1313

## 2022-07-01 NOTE — ED Triage Notes (Signed)
Here for facial swelling that started last night. Pt reports a small knot under her chin.

## 2022-07-12 ENCOUNTER — Inpatient Hospital Stay: Payer: Medicaid Other | Attending: Obstetrics & Gynecology | Admitting: Genetic Counselor

## 2022-07-12 ENCOUNTER — Inpatient Hospital Stay: Payer: Medicaid Other

## 2022-07-23 ENCOUNTER — Telehealth: Payer: Self-pay | Admitting: *Deleted

## 2022-07-23 NOTE — Telephone Encounter (Signed)
LMOM for the patient to call the office back. Patient needs to have her genetics appts rescheduled

## 2022-07-25 NOTE — Telephone Encounter (Signed)
LMOM for the patient to call the office back. Patient needs to have her genetics appts rescheduled  

## 2022-09-18 ENCOUNTER — Inpatient Hospital Stay: Payer: Medicaid Other

## 2022-09-18 ENCOUNTER — Inpatient Hospital Stay: Payer: Medicaid Other | Admitting: Genetic Counselor

## 2022-10-01 ENCOUNTER — Ambulatory Visit (INDEPENDENT_AMBULATORY_CARE_PROVIDER_SITE_OTHER): Payer: Medicaid Other

## 2022-10-01 ENCOUNTER — Encounter (HOSPITAL_COMMUNITY): Payer: Self-pay

## 2022-10-01 ENCOUNTER — Ambulatory Visit (HOSPITAL_COMMUNITY)
Admission: EM | Admit: 2022-10-01 | Discharge: 2022-10-01 | Disposition: A | Discharge status: Home / Self Care | Payer: Medicaid Other

## 2022-10-01 DIAGNOSIS — S93402A Sprain of unspecified ligament of left ankle, initial encounter: Secondary | ICD-10-CM | POA: Diagnosis not present

## 2022-10-01 DIAGNOSIS — M25572 Pain in left ankle and joints of left foot: Secondary | ICD-10-CM | POA: Diagnosis not present

## 2022-10-01 MED ORDER — IBUPROFEN 800 MG PO TABS
ORAL_TABLET | ORAL | Status: AC
  Filled 2022-10-01: qty 1

## 2022-10-01 MED ORDER — IBUPROFEN 800 MG PO TABS
800.0000 mg | ORAL_TABLET | Freq: Once | ORAL | Status: AC
  Administered 2022-10-01: 800 mg via ORAL

## 2022-10-01 NOTE — Discharge Instructions (Signed)
Ice your ankle and keep it propped up when you are at rest as much as you can to help the swelling go down. Use ibuprofen for pain per package directions.   Follow up with sports medicine if you are not getting better.

## 2022-10-01 NOTE — ED Provider Notes (Signed)
[facility]    CSN: [id number] Arrival date & time: 10/01/22  1641      History   Chief Complaint Chief Complaint  Patient presents with   Ankle Pain    HPI Meagan Brown is a 22 y.o. female. Today while walking, she rolled her L ankle, causing pain. Denies other injury. Hurts to bear weight.    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      Home Medications    Prior to Admission medications   Medication Sig Start Date End Date Taking? Authorizing Provider  etonogestrel (NEXPLANON) 68 MG IMPL implant 1 each by Subdermal route once.   Yes [provider]  clindamycin (CLEOCIN T) 1 % external solution Apply topically 2 (two) times daily. 03/23/22   Merrilyn Puma, MD  doxycycline (VIBRAMYCIN) 100 MG capsule Take 1 capsule (100 mg total) by mouth 2 (two) times daily. 07/01/22   Garrison, [place] N, FNP  ondansetron (ZOFRAN-ODT) 4 MG disintegrating tablet Take 1 tablet (4 mg total) by mouth every 8 (eight) hours as needed for nausea or vomiting. 07/01/22   Garrison, [place] N, FNP    Family History Family History  Problem Relation Age of Onset   Diabetes Maternal Grandmother    Ovarian cysts Paternal Grandfather    Diabetes Other    Breast cancer Other 50   Ovarian cysts Other    Ovarian cysts Other     Ovarian cysts Other    Cancer Other        Gyn - died with vaginal bleeding    Social History Social History   Tobacco Use   Smoking status: Never    Passive exposure: Yes   Smokeless tobacco: Never  Vaping Use   Vaping status: Never Used  Substance Use Topics   Alcohol use: Never    Alcohol/week: 0.0 standard drinks of alcohol   Drug use: Not Currently    Types: Marijuana    Comment: 2018     Allergies   Tomato and Minocycline   Review of Systems Review of Systems   Physical Exam Triage Vital Signs ED Triage Vitals  Encounter Vitals Group     BP 10/01/22 1654 (!) 126/90     Systolic BP Percentile --      Diastolic BP Percentile --      Pulse Rate 10/01/22 1654 100     Resp 10/01/22 1654 16     Temp 10/01/22 1654 98.4 F (36.9 C)     Temp Source 10/01/22 1654 Oral     SpO2 10/01/22 1654 96 %     Weight 10/01/22 1654 220 lb (99.8 kg)     Height 10/01/22 1654 5\' 4"  (1.626 m)     Head Circumference --      Peak Flow --  Pain Score 10/01/22 1653 9     Pain Loc --      Pain Education --      Exclude from Growth Chart --    No data found.  Updated Vital Signs BP (!) 126/90 (BP Location: Left Arm)   Pulse 100   Temp 98.4 F (36.9 C) (Oral)   Resp 16   Ht 5\' 4"  (1.626 m)   Wt 220 lb (99.8 kg)   SpO2 96%   BMI 37.76 kg/m   Visual Acuity Right Eye Distance:   Left Eye Distance:   Bilateral Distance:    Right Eye Near:   Left Eye Near:    Bilateral Near:     Physical Exam Constitutional:      General: She is not in acute distress.    Appearance: Normal appearance.  Pulmonary:     Effort: Pulmonary effort is normal.  Musculoskeletal:     Left ankle: Swelling present. Tenderness present. Normal pulse.     Comments: Swelling, tenderness to palpation L lateral malleolar area  Neurological:     Mental Status: She is alert.      UC Treatments / Results  Labs (all labs ordered are listed, but only abnormal results are displayed) Labs  Reviewed - No data to display  EKG   Radiology DG Ankle Complete Left  Result Date: 10/01/2022 CLINICAL DATA:  Fall with ankle pain EXAM: LEFT ANKLE COMPLETE - 3+ VIEW COMPARISON:  11/25/2015 FINDINGS: There is no evidence of fracture, dislocation, or joint effusion. There is no evidence of arthropathy or other focal bone abnormality. Soft tissues are unremarkable. IMPRESSION: Negative. Electronically Signed   By: Jasmine Pang M.D.   On: 10/01/2022 17:36    Procedures Procedures (including critical care time)  Medications Ordered in UC Medications  ibuprofen (ADVIL) tablet 800 mg (has no administration in time range)    Initial Impression / Assessment and Plan / UC Course  I have reviewed the triage vital signs and the nursing notes.  Pertinent labs & imaging results that were available during my care of the patient were reviewed by me and considered in my medical decision making (see chart for details).    No fx on xray. Pt given ASO and crutches in case it hurts to bear weight. F/u with sports medicine if needed.   Final Clinical Impressions(s) / UC Diagnoses   Final diagnoses:  Sprain of left ankle, unspecified ligament, initial encounter     Discharge Instructions      Ice your ankle and keep it propped up when you are at rest as much as you can to help the swelling go down. Use ibuprofen for pain per package directions.   Follow up with sports medicine if you are not getting better.    ED Prescriptions   None    PDMP not reviewed this encounter.   Cathlyn Parsons, NP 10/01/22 1744

## 2022-10-01 NOTE — ED Triage Notes (Addendum)
Patient here today with c/o left ankle pain after walking [place] the stairs in the [facility] parking deck and missed a step and twisting her left ankle about an hour ago. She did fall. She is now having increased pain on the lateral side of her ankle. She took 2 Tylenol with no relief.

## 2022-10-16 ENCOUNTER — Inpatient Hospital Stay: Payer: Medicaid Other

## 2022-10-16 ENCOUNTER — Inpatient Hospital Stay: Payer: Medicaid Other | Attending: Genetic Counselor | Admitting: Genetic Counselor

## 2022-12-05 ENCOUNTER — Encounter: Payer: Medicaid Other | Admitting: Nurse Practitioner

## 2022-12-10 ENCOUNTER — Ambulatory Visit (INDEPENDENT_AMBULATORY_CARE_PROVIDER_SITE_OTHER): Payer: Medicaid Other | Admitting: Nurse Practitioner

## 2022-12-10 ENCOUNTER — Encounter: Payer: Self-pay | Admitting: Nurse Practitioner

## 2022-12-10 VITALS — BP 124/90 | HR 83 | Ht 64.5 in | Wt 232.0 lb

## 2022-12-10 DIAGNOSIS — Z01419 Encounter for gynecological examination (general) (routine) without abnormal findings: Secondary | ICD-10-CM

## 2022-12-10 DIAGNOSIS — C801 Malignant (primary) neoplasm, unspecified: Secondary | ICD-10-CM

## 2022-12-10 DIAGNOSIS — Z3009 Encounter for other general counseling and advice on contraception: Secondary | ICD-10-CM

## 2022-12-10 DIAGNOSIS — Z113 Encounter for screening for infections with a predominantly sexual mode of transmission: Secondary | ICD-10-CM

## 2022-12-10 DIAGNOSIS — Z23 Encounter for immunization: Secondary | ICD-10-CM

## 2022-12-10 DIAGNOSIS — N898 Other specified noninflammatory disorders of vagina: Secondary | ICD-10-CM

## 2022-12-10 NOTE — Progress Notes (Signed)
Meagan Brown October 14, 2000 [id number]   History:  22 y.o. G0 presents as new patient to establish care. Complaints of vaginal discharge and intermittent odor. Denies itching/irritation. Would like STD screening today. Nexplanon 02/2021. Feels like Nexplanon has affected her moods and has caused weight gain. Bra size has increased 2 sizes. Normal pap history. 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 Genetic testing recommended but not completed by patient. H/O HS. Received 1 HPV vaccine in 2017.   Gynecologic History Patient's last menstrual period was 11/27/2022 (exact date).   Contraception/Family planning: Nexplanon Sexually active: Yes  Health Maintenance Last Pap: 05/23/2022. Results were: Normal Last mammogram: Not indicated Last colonoscopy: Not indicated Last Dexa: Not indicated   Past medical history, past surgical history, family history and social history were all reviewed and documented in the EPIC chart. Works at [employer].   ROS:  A ROS was performed and pertinent positives and negatives are included.  Exam:  Vitals:   12/10/22 1024  BP: (!) 124/90  Pulse: 83  SpO2: 98%  Weight: 232 lb (105.2 kg)  Height: 5' 4.5" (1.638 m)   Body mass index is 39.21 kg/m.  General appearance:  Normal Thyroid:  Symmetrical, normal in size, without palpable masses or nodularity. Respiratory  Auscultation:  Clear without wheezing or rhonchi Cardiovascular  Auscultation:  Regular rate, without rubs, murmurs or gallops  Edema/varicosities:  Not grossly evident Abdominal  Soft,nontender, without masses, guarding or rebound.  Liver/spleen:  No organomegaly noted  Hernia:  None appreciated  Skin  Inspection:  Grossly normal Breasts: Not indicated per guidelines Pelvic: External genitalia:  no lesions              Urethra:  normal appearing urethra with no masses, tenderness or lesions              Bartholins and Skenes: normal                  Vagina: normal appearing vagina with normal color and discharge, no lesions              Cervix: no lesions Bimanual Exam:  Uterus:  no masses or tenderness              Adnexa: no mass, fullness, tenderness              Rectovaginal: Deferred              Anus:  normal, no lesions  Patient informed chaperone available to be present for breast and pelvic exam. Patient has requested no chaperone to be present. Patient has been advised what will be completed during breast and pelvic exam.   Assessment/Plan:  22 y.o. G0 to establish care.   Well female exam with routine gynecological exam - Education provided on SBEs, importance of preventative screenings, current guidelines, high calcium diet, regular exercise, and multivitamin daily.  Labs with PCP.   Screening examination for STD (sexually transmitted disease) - Plan: SureSwab Advanced Vaginitis Plus,TMA  Vaginal discharge - Plan: SureSwab Advanced Vaginitis Plus,TMA  Need for HPV vaccine - Plan: HPV 9-valent vaccine,Recombinat. Return in 4 months for last dose.   Mixed germ cell tumor (HCC) - Plan: Ambulatory referral to Genetics  General counseling and advice on female contraception - Considering Nexplanon removal. Contraceptive options were reviewed, including hormonal methods, both combination (pill, patch, vaginal ring) and progesterone-only (pill, Depo Provera and Nexplanon), intrauterine devices (Mirena, Caroleen, [place], and [place]), Phexxi, barrier  methods (condoms, diaphragm) and female/female sterilization. The mechanisms, risks, benefits and side effects of all methods were discussed. Interested in OCPs. Will call if she decides to have Nexplanon removed.   Screening for cervical cancer - Normal Pap history.  UTD.   Return in about 1 year (around 12/10/2023) for Annual.    Olivia Mackie DNP, 11:12 AM 12/10/2022

## 2022-12-11 LAB — SURESWAB® ADVANCED VAGINITIS PLUS,TMA
C. trachomatis RNA, TMA: NOT DETECTED
CANDIDA SPECIES: DETECTED — AB
Candida glabrata: NOT DETECTED
N. gonorrhoeae RNA, TMA: NOT DETECTED
SURESWAB(R) ADV BACTERIAL VAGINOSIS(BV),TMA: NEGATIVE
TRICHOMONAS VAGINALIS (TV),TMA: NOT DETECTED

## 2022-12-12 ENCOUNTER — Encounter: Payer: Self-pay | Admitting: Nurse Practitioner

## 2022-12-12 ENCOUNTER — Other Ambulatory Visit: Payer: Self-pay | Admitting: Nurse Practitioner

## 2022-12-12 ENCOUNTER — Telehealth: Payer: Self-pay | Admitting: Nurse Practitioner

## 2022-12-12 DIAGNOSIS — B3731 Acute candidiasis of vulva and vagina: Secondary | ICD-10-CM

## 2022-12-12 MED ORDER — FLUCONAZOLE 150 MG PO TABS: 150.0000 mg | ORAL_TABLET | ORAL | 0 refills | Status: DC

## 2022-12-26 ENCOUNTER — Encounter: Payer: Self-pay | Admitting: Nurse Practitioner

## 2022-12-26 ENCOUNTER — Ambulatory Visit (INDEPENDENT_AMBULATORY_CARE_PROVIDER_SITE_OTHER): Payer: Medicaid Other | Admitting: Nurse Practitioner

## 2022-12-26 VITALS — BP 110/76 | HR 89 | Wt 236.0 lb

## 2022-12-26 DIAGNOSIS — Z30011 Encounter for initial prescription of contraceptive pills: Secondary | ICD-10-CM

## 2022-12-26 DIAGNOSIS — Z3046 Encounter for surveillance of implantable subdermal contraceptive: Secondary | ICD-10-CM | POA: Diagnosis not present

## 2022-12-26 MED ORDER — NORETHIN ACE-ETH ESTRAD-FE 1-20 MG-MCG PO TABS: 1.0000 | ORAL_TABLET | Freq: Every day | ORAL | 3 refills | Status: DC

## 2022-12-26 NOTE — Progress Notes (Signed)
22 y.o. G0P0000 presents for Nexplanon removal.  She has been experiencing mood change and weight gain.  She has decided to use COCs for future contraception.  Procedure, risks and benefits have all been explained.  She has the following questions today: use, missed doses, side effects.     LMP:  11/27/2022   After all questions were answered, consent was obtained.    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 2 tablet 0   No current facility-administered medications on file prior to visit.   Allergies  Allergen Reactions   Tomato Hives   Latex Rash   Minocycline     Nightmares    Vitals:   12/26/22 1533  BP: 110/76  Pulse: 89  SpO2: 99%   Physical Exam  Procedure: Patient placed supine on exam table with her left arm flexed at the elbow. The prior insertion site was located and the Nexplanon rod was palpated.  Area cleansed with Betadine x 3 and draped in normal sterile fashion.  Insertion site and surrounding tissue anesthetized with 1% Lidocaine without epinephrine, 2cc total used.  Small incision made with #11 blade.  Nexplanon removed without difficulty.  Steri-strips were applied and pressure dressing placed over the site.  Entire procedure performed with sterile technique.  Pt tolerated procedure well.  Assessment: Nexplanon removal  Plan:  Post procedure instructions reviewed with pt.  Questions answered.  Pt knows to call with any concerns or questions.  New contraception:   COCs   Encounter for initial prescription of contraceptive pills - Plan: norethindrone-ethinyl estradiol-FE (LOESTRIN FE) 1-20 MG-MCG tablet daily. Educated on proper use, missed pills, and possible side effects.   Nexplanon removal - Plan: Removal of implanon rod.

## 2023-02-07 ENCOUNTER — Inpatient Hospital Stay: Payer: Medicaid Other | Attending: Genetic Counselor | Admitting: Genetic Counselor

## 2023-02-07 ENCOUNTER — Inpatient Hospital Stay: Payer: Medicaid Other

## 2023-02-21 ENCOUNTER — Telehealth: Payer: Self-pay | Admitting: Genetic Counselor

## 2023-02-21 NOTE — Telephone Encounter (Signed)
 Made three attempts to get patient scheduled for genetic counseling patient has not answered or returned calls. Closing patients referral no further attempt will be made to reach this patient.

## 2023-04-02 ENCOUNTER — Ambulatory Visit: Payer: Medicaid Other

## 2023-04-17 ENCOUNTER — Ambulatory Visit: Payer: Medicaid Other | Admitting: Obstetrics and Gynecology

## 2023-04-17 ENCOUNTER — Ambulatory Visit: Payer: Medicaid Other | Admitting: Nurse Practitioner

## 2023-04-24 ENCOUNTER — Ambulatory Visit: Payer: Medicaid Other | Admitting: Nurse Practitioner

## 2023-05-21 ENCOUNTER — Ambulatory Visit: Payer: Medicaid Other | Admitting: Nurse Practitioner

## 2023-06-05 ENCOUNTER — Ambulatory Visit: Payer: Self-pay | Admitting: Nurse Practitioner

## 2023-08-25 ENCOUNTER — Encounter: Payer: Self-pay | Admitting: *Deleted

## 2024-01-13 ENCOUNTER — Other Ambulatory Visit: Payer: Self-pay

## 2024-01-13 ENCOUNTER — Ambulatory Visit (INDEPENDENT_AMBULATORY_CARE_PROVIDER_SITE_OTHER): Payer: Self-pay

## 2024-01-13 ENCOUNTER — Ambulatory Visit: Payer: Self-pay

## 2024-01-13 VITALS — BP 128/84 | HR 73 | Temp 98.9°F | Ht 64.5 in | Wt 236.2 lb

## 2024-01-13 DIAGNOSIS — Z8543 Personal history of malignant neoplasm of ovary: Secondary | ICD-10-CM

## 2024-01-13 DIAGNOSIS — Z9079 Acquired absence of other genital organ(s): Secondary | ICD-10-CM

## 2024-01-13 DIAGNOSIS — D367 Benign neoplasm of other specified sites: Secondary | ICD-10-CM

## 2024-01-13 DIAGNOSIS — Z90721 Acquired absence of ovaries, unilateral: Secondary | ICD-10-CM

## 2024-01-13 DIAGNOSIS — N63 Unspecified lump in unspecified breast: Secondary | ICD-10-CM | POA: Insufficient documentation

## 2024-01-13 DIAGNOSIS — N6321 Unspecified lump in the left breast, upper outer quadrant: Secondary | ICD-10-CM

## 2024-01-13 NOTE — Assessment & Plan Note (Signed)
 Patient also notes a small nodule of lump in her left neck area which she states has been present for the past year.  She denies pain, drainage, difficulty swallowing, or sore throat.  She has no pain with eating and states that she has not noticed any skin changes or drainage from the area.  On physical exam small, roughly 3 mm nodular, firm and superficial cyst appreciated.  There is no tenderness to palpation.  Suspect that this is a dermoid cyst of her neck and feel there is nothing further to do at this time other than surveillance.  She does have a history of hidradenitis suppurativa, though this is inconsistent with a boil.  Will continue to monitor this.

## 2024-01-13 NOTE — Assessment & Plan Note (Addendum)
 Patient presents to clinic today with concerns of left breast lump that she noticed about a week ago and has been slightly tender to touch.  On physical exam, left breast with firm, nodular mass appreciated in the upper outer quadrant.  There are no skin changes present, no left axillary lymph nodes appreciated, and no nipple discharge.  Right breast and right axilla normal on physical exam.  We discussed that some changes in breast tissue can be normal with hormonal changes with menstrual cycle, though given firm nodular mass appreciated on physical exam as well as patient's personal history of mixed germ cell tumor, we will plan to obtain left breast ultrasound with left axillary lymph nodes.  She does follow with OB/GYN NP Meagan Brown and was last seen by her on 12/26/2022, and is due for her annual visit at this time.  Advised her that I would like her to schedule her annual visit with her OB/GYN for healthcare maintenance, and we will begin workup for left breast lump. - Ultrasound left breast + left axilla - f/u with OBGYN NP Meagan Brown

## 2024-01-13 NOTE — Progress Notes (Signed)
 Patient name: Meagan Brown Date of birth: 2000/09/20 Date of visit: 01/13/24  Type of visit: Acute Office Visit   Subjective   Chief concern:  Chief Complaint  Patient presents with   Follow-up    Patient here to regarding lump found in left breast -1week    Meagan Brown is a 23 y.o. female with a history of hidradenitis suppurativa and stage Ia mixed germ cell tumor (s/p left salpingo-oophorectomy 09/2017), who presents to [facility] clinic for evaluation of left breast lump.  Patient presents today with concerns of a lump that she felt in her left breast about a week ago.  She denies any nipple discharge, and no boils or skin changes in the area.  She notes that the lump is mildly painful when she presses on it.  She denies a known family history of breast cancer, though has a family history of other cancers.  She does have a personal history of stage Ia mixed germ cell tumor, status post oophorectomy in 2019 at [facility].  Her last annual OB/GYN appointment was 12/26/2022, with NP Meagan Brown.  Patient reports that she did lose her insurance for a bit of time earlier this year, though has Medicaid now.  She has not yet scheduled her follow-up annual OB/GYN appointment.  She does also note that she feels a lump in her left neck/chin which has been present for the past year. She denies pain, drainage, difficulty swallowing, or sore throat.  She has no pain with eating and states that she has not noticed any skin changes or drainage from the area.    ROS: Denies headaches, dizziness, fever, chills, runny nose, sore throat, difficulty swallowing chest pain, shortness of breath, difficulty breathing, nausea, vomiting, abdominal pain. Denies pain with urination, constipation or diarrhea. No recent falls.   Patient Active Problem List   Diagnosis Date Noted   Breast lump in upper outer quadrant 01/13/2024   Dermoid cyst of neck 01/13/2024   Obesity (BMI 30-39.9) 04/13/2021   Healthcare  maintenance 04/13/2021   Hidradenitis suppurativa 04/13/2021   Malignant germ cell tumor (HCC) 01/29/2018   Menstrual irregularity 07/12/2017   Vision problem 08/12/2015     Past Surgical History:  Procedure Laterality Date   OOPHORECTOMY Left    Exlap/LSO/Omental Biopsy at [facility] 10/04/17     Current Outpatient Medications  Medication Instructions   adalimumab (HUMIRA) 80 MG/0.8ML pen Inject 160mg  (contents of 2 pens) into the skin on day 1 and inject 80mg  (contents of 1 pen) into the skin on day 15. Then start maintenance dose 2 weeks later   adalimumab (HUMIRA, 2 PEN,) 40 MG/0.4ML pen Inject into the skin.   fluconazole  (DIFLUCAN ) 150 mg, Oral, Every 3 DAYS   moxifloxacin (AVELOX) 400 mg, Daily   norethindrone-ethinyl estradiol-FE (LOESTRIN FE) 1-20 MG-MCG tablet 1 tablet, Oral, Daily   silver sulfADIAZINE (SILVADENE) 1 % cream Apply to affected areas once a day   spironolactone (ALDACTONE) 50 MG tablet 1 tablet, Daily    Social History   Tobacco Use   Smoking status: Some Days    Types: E-cigarettes    Passive exposure: Yes   Smokeless tobacco: Never  Vaping Use   Vaping status: Never Used  Substance Use Topics   Alcohol use: Yes    Comment: occasional   Drug use: Not Currently    Types: Marijuana    Comment: 2018      Objective  Today's Vitals   01/13/24 0856 01/13/24 0900  01/13/24 0916  BP: (!) 141/93 (!) 133/93 128/84  Pulse: 73 73   Temp: 98.9 F (37.2 C)    TempSrc: Oral    SpO2: 97%    Weight: 236 lb 3.2 oz (107.1 kg)    Height: 5' 4.5 (1.638 m)    PainSc: 0-No pain    Body mass index is 39.92 kg/m.   Physical Exam:   Constitutional: well-appearing female sitting in exam chair, in no acute distress. Ambulates without use of assistance device  HEENT: normocephalic atraumatic, mucous membranes moist Eyes: conjunctiva non-erythematous Neck: supple, superficial nodular density of left neck without skin changes, no tenderness to palpation   Cardiovascular: regular rate and rhythm, bilateral radial pulses 2+, bilateral dorsal pedal pulses 2+, brisk capillary refill bilateral feet and hands  Pulmonary/Chest: normal work of breathing on room air, lungs clear to auscultation bilaterally Breast: Left upper outer breast with firm, nodular mass appreciated and no skin changes, no left axillary lymph nodes appreciated, no nipple discharge; right breast and right axilla normal breast exam  Abdominal: soft, non-tender, non-distended MSK: normal bulk and tone. Neurological: alert & oriented x 3 Skin: warm and dry Psych: mood calm, behavior normal, thought content normal, judgement normal      The ASCVD Risk score (Arnett DK, et al., 2019) failed to calculate for the following reasons:   The 2019 ASCVD risk score is only valid for ages 5 to 61      Assessment & Plan  Problem List Items Addressed This Visit       Musculoskeletal and Integument   Dermoid cyst of neck   Patient also notes a small nodule of lump in her left neck area which she states has been present for the past year.  She denies pain, drainage, difficulty swallowing, or sore throat.  She has no pain with eating and states that she has not noticed any skin changes or drainage from the area.  On physical exam small, roughly 3 mm nodular, firm and superficial cyst appreciated.  There is no tenderness to palpation.  Suspect that this is a dermoid cyst of her neck and feel there is nothing further to do at this time other than surveillance.  She does have a history of hidradenitis suppurativa, though this is inconsistent with a boil.  Will continue to monitor this.        Other   Breast lump in upper outer quadrant - Primary   Patient presents to clinic today with concerns of left breast lump that she noticed about a week ago and has been slightly tender to touch.  On physical exam, left breast with firm, nodular mass appreciated in the upper outer quadrant.  There are no  skin changes present, no left axillary lymph nodes appreciated, and no nipple discharge.  Right breast and right axilla normal on physical exam.  We discussed that some changes in breast tissue can be normal with hormonal changes with menstrual cycle, though given firm nodular mass appreciated on physical exam as well as patient's personal history of mixed germ cell tumor, we will plan to obtain left breast ultrasound with left axillary lymph nodes.  She does follow with OB/GYN NP Meagan Brown and was last seen by her on 12/26/2022, and is due for her annual visit at this time.  Advised her that I would like her to schedule her annual visit with her OB/GYN for healthcare maintenance, and we will begin workup for left breast lump. - Ultrasound left breast +  left axilla - f/u with OBGYN NP Meagan Brown       Relevant Orders   US  LIMITED ULTRASOUND INCLUDING AXILLA LEFT BREAST     Return if symptoms worsen or fail to improve. Follow up with OBGYN NP Meagan Brown.   Patient discussed with Dr. Karna, who also saw and evaluated the patient.  Doyal Miyamoto, MD [facility]  PGY-1 01/13/2024, 8:29 PM

## 2024-01-13 NOTE — Patient Instructions (Addendum)
 Do thank you, Ms.Daney P Pais for allowing us  to provide your care today. Today we discussed the following:  - I will call or MyChart you with your ultrasound results when they return    Tests ordered today:  Left breast ultrasound    Follow up: for your annual visit with OBGYN    Should you have any questions or concerns please call the Internal Medicine Clinic at [phone].     Doyal Miyamoto, MD [facility]

## 2024-01-15 ENCOUNTER — Ambulatory Visit
Admission: RE | Admit: 2024-01-15 | Discharge: 2024-01-15 | Disposition: A | Discharge status: Home / Self Care | Payer: Self-pay | Source: Ambulatory Visit | Attending: Family Medicine | Admitting: Family Medicine

## 2024-01-15 DIAGNOSIS — N63 Unspecified lump in unspecified breast: Secondary | ICD-10-CM

## 2024-01-20 ENCOUNTER — Other Ambulatory Visit: Payer: Self-pay | Admitting: Family Medicine

## 2024-01-20 DIAGNOSIS — N6320 Unspecified lump in the left breast, unspecified quadrant: Secondary | ICD-10-CM

## 2024-01-20 NOTE — Progress Notes (Signed)
 Internal Medicine Clinic Attending  I was physically present during the key portions of the resident provided service and participated in the medical decision making of patient's management care. I reviewed pertinent patient test results.  The assessment, diagnosis, and plan were formulated together and I agree with the documentation in the resident's note.  Dickie La, MD

## 2024-03-25 ENCOUNTER — Encounter: Payer: Self-pay | Admitting: Nurse Practitioner

## 2024-03-25 ENCOUNTER — Ambulatory Visit: Payer: Self-pay | Admitting: Nurse Practitioner

## 2024-03-25 VITALS — BP 114/70 | HR 68 | Resp 16 | Ht 64.75 in | Wt 236.0 lb

## 2024-03-25 DIAGNOSIS — Z1331 Encounter for screening for depression: Secondary | ICD-10-CM

## 2024-03-25 DIAGNOSIS — Z3041 Encounter for surveillance of contraceptive pills: Secondary | ICD-10-CM

## 2024-03-25 DIAGNOSIS — N912 Amenorrhea, unspecified: Secondary | ICD-10-CM

## 2024-03-25 DIAGNOSIS — Z01419 Encounter for gynecological examination (general) (routine) without abnormal findings: Secondary | ICD-10-CM | POA: Diagnosis not present

## 2024-03-25 DIAGNOSIS — Z23 Encounter for immunization: Secondary | ICD-10-CM

## 2024-03-25 DIAGNOSIS — D242 Benign neoplasm of left breast: Secondary | ICD-10-CM | POA: Diagnosis not present

## 2024-03-25 DIAGNOSIS — Z3201 Encounter for pregnancy test, result positive: Secondary | ICD-10-CM | POA: Diagnosis not present

## 2024-03-25 DIAGNOSIS — Z3A01 Less than 8 weeks gestation of pregnancy: Secondary | ICD-10-CM

## 2024-03-25 DIAGNOSIS — Z113 Encounter for screening for infections with a predominantly sexual mode of transmission: Secondary | ICD-10-CM

## 2024-03-25 LAB — PREGNANCY, URINE: Preg Test, Ur: POSITIVE — AB

## 2024-03-26 LAB — COMPREHENSIVE METABOLIC PANEL WITH GFR
AG Ratio: 1.5 (calc) (ref 1.0–2.5)
ALT: 37 U/L — ABNORMAL HIGH (ref 6–29)
AST: 21 U/L (ref 10–30)
Albumin: 4.1 g/dL (ref 3.6–5.1)
Alkaline phosphatase (APISO): 36 U/L (ref 31–125)
BUN: 10 mg/dL (ref 7–25)
CO2: 23 mmol/L (ref 20–32)
Calcium: 9 mg/dL (ref 8.6–10.2)
Chloride: 105 mmol/L (ref 98–110)
Creat: 0.56 mg/dL (ref 0.50–0.96)
Globulin: 2.8 g/dL (ref 1.9–3.7)
Glucose, Bld: 87 mg/dL (ref 65–99)
Potassium: 4.4 mmol/L (ref 3.5–5.3)
Sodium: 136 mmol/L (ref 135–146)
Total Bilirubin: 0.3 mg/dL (ref 0.2–1.2)
Total Protein: 6.9 g/dL (ref 6.1–8.1)
eGFR: 131 mL/min/{1.73_m2}

## 2024-03-26 LAB — CBC WITH DIFFERENTIAL/PLATELET
Absolute Lymphocytes: 1949 {cells}/uL (ref 850–3900)
Absolute Monocytes: 458 {cells}/uL (ref 200–950)
Basophils Absolute: 12 {cells}/uL (ref 0–200)
Basophils Relative: 0.2 %
Eosinophils Absolute: 52 {cells}/uL (ref 15–500)
Eosinophils Relative: 0.9 %
HCT: 36.3 % (ref 35.9–46.0)
Hemoglobin: 11.5 g/dL — ABNORMAL LOW (ref 11.7–15.5)
MCH: 26.8 pg — ABNORMAL LOW (ref 27.0–33.0)
MCHC: 31.7 g/dL (ref 31.6–35.4)
MCV: 84.6 fL (ref 81.4–101.7)
MPV: 10.6 fL (ref 7.5–12.5)
Monocytes Relative: 7.9 %
Neutro Abs: 3329 {cells}/uL (ref 1500–7800)
Neutrophils Relative %: 57.4 %
Platelets: 284 10*3/uL (ref 140–400)
RBC: 4.29 Million/uL (ref 3.80–5.10)
RDW: 13.9 % (ref 11.0–15.0)
Total Lymphocyte: 33.6 %
WBC: 5.8 10*3/uL (ref 3.8–10.8)

## 2024-03-26 LAB — SURESWAB CT/NG/T. VAGINALIS
C. trachomatis RNA, TMA: NOT DETECTED
N. gonorrhoeae RNA, TMA: NOT DETECTED
Trichomonas vaginalis RNA: NOT DETECTED

## 2024-03-26 LAB — HIV ANTIBODY (ROUTINE TESTING W REFLEX)
HIV 1&2 Ab, 4th Generation: NONREACTIVE
HIV FINAL INTERPRETATION: NEGATIVE

## 2024-03-26 LAB — SYPHILIS: RPR W/REFLEX TO RPR TITER AND TREPONEMAL ANTIBODIES, TRADITIONAL SCREENING AND DIAGNOSIS ALGORITHM: RPR Ser Ql: NONREACTIVE

## 2024-03-27 ENCOUNTER — Ambulatory Visit: Payer: Self-pay | Admitting: Nurse Practitioner

## 2024-03-27 NOTE — Telephone Encounter (Signed)
 Please advise.

## 2024-03-30 ENCOUNTER — Encounter (HOSPITAL_BASED_OUTPATIENT_CLINIC_OR_DEPARTMENT_OTHER): Payer: Self-pay

## 2024-07-15 ENCOUNTER — Other Ambulatory Visit: Payer: Self-pay

## 2025-03-29 ENCOUNTER — Ambulatory Visit: Admitting: Nurse Practitioner
